# Patient Record
Sex: Male | Born: 1970 | Race: White | Hispanic: No | Marital: Married
Health system: Southern US, Community
[De-identification: ages and names within clinical notes are randomized; demographics above are authoritative.]

---

## 1997-07-12 ENCOUNTER — Emergency Department (HOSPITAL_COMMUNITY): Admission: EM | Admit: 1997-07-12 | Discharge: 1997-07-13 | Payer: Self-pay | Admitting: Emergency Medicine

## 1998-12-19 ENCOUNTER — Ambulatory Visit (HOSPITAL_BASED_OUTPATIENT_CLINIC_OR_DEPARTMENT_OTHER): Admission: RE | Admit: 1998-12-19 | Discharge: 1998-12-19 | Payer: Self-pay | Admitting: Plastic Surgery

## 2004-10-19 ENCOUNTER — Encounter: Admission: RE | Admit: 2004-10-19 | Discharge: 2004-10-19 | Payer: Self-pay | Admitting: Orthopedic Surgery

## 2021-02-18 ENCOUNTER — Other Ambulatory Visit: Payer: Self-pay

## 2021-02-18 ENCOUNTER — Emergency Department (HOSPITAL_COMMUNITY)
Admission: EM | Admit: 2021-02-18 | Discharge: 2021-02-19 | Disposition: A | Discharge status: Home / Self Care | Payer: BC Managed Care – PPO | Attending: Emergency Medicine | Admitting: Emergency Medicine

## 2021-02-18 ENCOUNTER — Emergency Department (HOSPITAL_COMMUNITY): Payer: BC Managed Care – PPO

## 2021-02-18 DIAGNOSIS — I160 Hypertensive urgency: Secondary | ICD-10-CM

## 2021-02-18 DIAGNOSIS — I10 Essential (primary) hypertension: Secondary | ICD-10-CM | POA: Insufficient documentation

## 2021-02-18 DIAGNOSIS — R079 Chest pain, unspecified: Secondary | ICD-10-CM | POA: Diagnosis present

## 2021-02-18 DIAGNOSIS — R002 Palpitations: Secondary | ICD-10-CM | POA: Insufficient documentation

## 2021-02-18 DIAGNOSIS — R42 Dizziness and giddiness: Secondary | ICD-10-CM | POA: Diagnosis not present

## 2021-02-18 DIAGNOSIS — Z79899 Other long term (current) drug therapy: Secondary | ICD-10-CM | POA: Insufficient documentation

## 2021-02-18 LAB — BASIC METABOLIC PANEL
Anion gap: 12 (ref 5–15)
BUN: 13 mg/dL (ref 6–20)
CO2: 27 mmol/L (ref 22–32)
Calcium: 9.7 mg/dL (ref 8.9–10.3)
Chloride: 95 mmol/L — ABNORMAL LOW (ref 98–111)
Creatinine, Ser: 1.08 mg/dL (ref 0.61–1.24)
Glucose, Bld: 108 mg/dL — ABNORMAL HIGH (ref 70–99)
Potassium: 4 mmol/L (ref 3.5–5.1)
Sodium: 134 mmol/L — ABNORMAL LOW (ref 135–145)

## 2021-02-18 LAB — CBC
HCT: 45.6 % (ref 39.0–52.0)
Hemoglobin: 15.5 g/dL (ref 13.0–17.0)
MCH: 32.6 pg (ref 26.0–34.0)
MCHC: 34 g/dL (ref 30.0–36.0)
MCV: 96 fL (ref 80.0–100.0)
Platelets: 182 10*3/uL (ref 150–400)
RBC: 4.75 MIL/uL (ref 4.22–5.81)
RDW: 12.4 % (ref 11.5–15.5)
WBC: 5.1 10*3/uL (ref 4.0–10.5)
nRBC: 0 % (ref 0.0–0.2)

## 2021-02-18 LAB — TROPONIN I (HIGH SENSITIVITY): Troponin I (High Sensitivity): 8 ng/L (ref <18)

## 2021-02-18 MED ORDER — ASPIRIN 81 MG PO CHEW: 324.0000 mg | CHEWABLE_TABLET | Freq: Once | ORAL | Status: DC

## 2021-02-18 NOTE — ED Provider Triage Note (Signed)
Emergency Medicine Provider Triage Evaluation Note  Dan Lam , a 51 y.o. male  was evaluated in triage.  Pt complains of chest tightness, diaphoresis, some SOB with exertion earlier today. Was doing chores, working out when he felt new chest tightness / pressure. No radiation to arms / neck. 1st family history of ACS.  No diabetes, hypertension, high cholesterol at baseline. Has not taken anything for pain. No cardiac hx.  Review of Systems  Positive: Chest tightness, SHOB Negative: NVD  Physical Exam  BP (!) 186/114 (BP Location: Right Arm)    Pulse (!) 110    Temp 98 F (36.7 C)    Resp 16    Ht 5\' 9"  (1.753 m)    Wt 82.6 kg    SpO2 100%    BMI 26.88 kg/m  Gen:   Awake, no distress   Resp:  Normal effort  MSK:   Moves extremities without difficulty  Other:  Somewhat tachycardic on my exam  Medical Decision Making  Medically screening exam initiated at 9:59 PM.  Appropriate orders placed.  Avie Echevaria was informed that the remainder of the evaluation will be completed by another provider, this initial triage assessment does not replace that evaluation, and the importance of remaining in the ED until their evaluation is complete.  Workup initiated   Anselmo Pickler, [place] 02/18/21 2201

## 2021-02-18 NOTE — ED Triage Notes (Signed)
Pt c/o chest tightness, hypertension and palpitations.

## 2021-02-19 LAB — TROPONIN I (HIGH SENSITIVITY): Troponin I (High Sensitivity): 10 ng/L (ref <18)

## 2021-02-19 MED ORDER — AMLODIPINE BESYLATE 5 MG PO TABS
10.0000 mg | ORAL_TABLET | Freq: Once | ORAL | Status: AC
  Administered 2021-02-19: 10 mg via ORAL
  Filled 2021-02-19: qty 2

## 2021-02-19 MED ORDER — AMLODIPINE BESYLATE 10 MG PO TABS: 10.0000 mg | ORAL_TABLET | Freq: Every day | ORAL | 11 refills | Status: DC

## 2021-02-19 NOTE — Discharge Instructions (Signed)
Please take amlodipine 10mg  once daily and be sure to follow up with your PCP.

## 2021-02-19 NOTE — ED Provider Notes (Addendum)
[facility] EMERGENCY DEPARTMENT Provider Note   CSN: [id number] Arrival date & time: 02/18/21  2012     History  Chief Complaint  Patient presents with   Chest Pain   Hypertension    Dan Lam is a 51 y.o. male PMHx of HTN not on any medication presented with elevated BP noted on BP home monitor yesterday. Pt endorse associated dizziness, palpitations and chest discomfort. While in the Ed waiting room overnight, pt chest discomfort and dizziness resolved however palpitations persisted. Pt denies SOB, fever, chills or syncope. Pt endorse similar syx many years ago in which he was placed on antihypertensives. Otherwise, pt states he feels much better. Denies sick contacts. Denies smoking or illicit drug use, does endorse occasional alcohol use. Pt endorse poor eating habits over the holidays and likely consumed lots of "salty" foods that may have contributed to his elevated pressures.   Chest Pain Associated symptoms: palpitations   Associated symptoms: no dizziness, no fever, no headache, no nausea, no numbness, no shortness of breath and no vomiting   Hypertension Pertinent negatives include no chest pain, no headaches and no shortness of breath.      Home Medications Prior to Admission medications   Medication Sig Start Date End Date Taking? Authorizing Provider  amLODipine (NORVASC) 10 MG tablet Take 1 tablet (10 mg total) by mouth daily. 02/19/21 02/19/22 Yes Timothy Lasso, MD  dorzolamide-timolol (COSOPT) 22.3-6.8 MG/ML ophthalmic solution Place 1 drop into the left eye 2 (two) times daily.   Yes [provider]  ibuprofen (ADVIL) 200 MG tablet Take 400 mg by mouth every 6 (six) hours as needed for moderate pain or headache.   Yes [provider]      Allergies    Patient has no known allergies.    Review of Systems   Review of Systems  Constitutional:  Negative for chills and fever.  Eyes:  Negative for visual disturbance.   Respiratory:  Negative for shortness of breath.   Cardiovascular:  Positive for palpitations. Negative for chest pain.  Gastrointestinal:  Negative for constipation, diarrhea, nausea and vomiting.  Neurological:  Negative for dizziness, numbness and headaches.   Physical Exam Updated Vital Signs BP (!) 170/107    Pulse 88    Temp 98.2 F (36.8 C)    Resp 18    Ht 5\' 9"  (1.753 m)    Wt 82.6 kg    SpO2 100%    BMI 26.88 kg/m  Physical Exam Constitutional:      General: He is not in acute distress. HENT:     Head: Normocephalic and atraumatic.  Cardiovascular:     Rate and Rhythm: Normal rate and regular rhythm.     Heart sounds: Normal heart sounds.  Pulmonary:     Effort: Pulmonary effort is normal.     Breath sounds: Normal breath sounds and air entry.  Abdominal:     General: Abdomen is flat.     Palpations: Abdomen is soft.  Musculoskeletal:     Right lower leg: No edema.     Left lower leg: No edema.  Skin:    General: Skin is warm and dry.  Neurological:     General: No focal deficit present.     Mental Status: He is alert and oriented to person, place, and time. Mental status is at baseline.  Psychiatric:        Behavior: Behavior normal. Behavior is cooperative.    ED Results / Procedures /  Treatments   Labs (all labs ordered are listed, but only abnormal results are displayed) Labs Reviewed  BASIC METABOLIC PANEL - Abnormal; Notable for the following components:      Result Value   Sodium 134 (*)    Chloride 95 (*)    Glucose, Bld 108 (*)    All other components within normal limits  CBC  TROPONIN I (HIGH SENSITIVITY)  TROPONIN I (HIGH SENSITIVITY)    EKG None  Radiology DG Chest 2 View  Result Date: 02/18/2021 CLINICAL DATA:  Chest pain EXAM: CHEST - 2 VIEW COMPARISON:  None. FINDINGS: The heart size and mediastinal contours are within normal limits. Both lungs are clear. Old fifth rib fracture. IMPRESSION: No active cardiopulmonary disease.  Electronically Signed   By: Donavan Foil M.D.   On: 02/18/2021 22:24    Procedures Procedures    Medications Ordered in ED Medications  aspirin chewable tablet 324 mg (has no administration in time range)  amLODipine (NORVASC) tablet 10 mg (10 mg Oral Given 02/19/21 1217)    ED Course/ Medical Decision Making/ A&P                           Medical Decision Making  Severe asymptomatic hypertension Pt presents with elevated SBP >180. On exam, pt is asymptomatic; denies CP, SOB, headache, nausea or vomiting. Pt was initially tachycardic in triage which since resolved in the ED. EKG sinus tachycardia, non concerning for ischemia. CXR negative for any active cardiopulmonary disease. Initial labs were reassuring. No end organ damage noted. Cr levels were within normal limits. Trops were flat. Pt given a dose of amlodipine. Otherwise, pt clinically stable and has an upcoming PCP appt this week. Pt is stable for discharge and will be discharged with amlodipine 10mg  daily.        Final Clinical Impression(s) / ED Diagnoses Final diagnoses:  Hypertensive urgency    Rx / DC Orders ED Discharge Orders          Ordered    amLODipine (NORVASC) 10 MG tablet  Daily        02/19/21 1215              Timothy Lasso, MD 02/19/21 1223    Timothy Lasso, MD 02/19/21 1227    Isla Pence, MD 02/19/21 1320

## 2021-02-19 NOTE — ED Notes (Signed)
Pt verbalized understanding of d/c instructions, meds and followup care. Denies questions. VSS, no distress noted. Steady gait to exit with all belongings.  ?

## 2021-03-30 ENCOUNTER — Institutional Professional Consult (permissible substitution): Payer: BC Managed Care – PPO | Admitting: Plastic Surgery

## 2021-04-20 ENCOUNTER — Other Ambulatory Visit: Payer: Self-pay

## 2021-04-20 ENCOUNTER — Ambulatory Visit: Payer: BC Managed Care – PPO | Admitting: Plastic Surgery

## 2021-04-20 VITALS — BP 110/74 | HR 67 | Ht 69.0 in | Wt 194.6 lb

## 2021-04-20 DIAGNOSIS — D489 Neoplasm of uncertain behavior, unspecified: Secondary | ICD-10-CM | POA: Diagnosis not present

## 2021-04-20 NOTE — Progress Notes (Signed)
? ?  Referring Provider ?Chesley Noon, MD ?[place],  [place] [address]  ? ?CC:  ?Left temple nevus, left scalp lesion ? ? ?Dan Lam is an 51 y.o. male.  ?HPI: Patient is a 51 year old with 2 lesions he is concerned about.  He has a left temple lesion which was shaved many years ago.  He does not have the path report available but he would like this removed completely.  He also has a lesion on his left scalp vertex that he would like removed.  He has been told that this may be a seborrheic keratosis.  Lesions have gotten slightly larger in size. ? ?No Known Allergies ? ?Outpatient Encounter Medications as of 04/20/2021  ?Medication Sig  ? dorzolamide-timolol (COSOPT) 22.3-6.8 MG/ML ophthalmic solution Place 1 drop into the left eye 2 (two) times daily.  ? ibuprofen (ADVIL) 200 MG tablet Take 400 mg by mouth every 6 (six) hours as needed for moderate pain or headache.  ? olmesartan (BENICAR) 40 MG tablet Take 40 mg by mouth daily.  ? [DISCONTINUED] amLODipine (NORVASC) 10 MG tablet Take 1 tablet (10 mg total) by mouth daily.  ? ?No facility-administered encounter medications on file as of 04/20/2021.  ?  ? ?Past medical history: Hypertension ? ?No family history on file. ? ?Social History  ? ?Social History Narrative  ? Not on file  ?  ? ?Review of Systems ?General: Denies fevers, chills, weight loss ?CV: Denies chest pain, shortness of breath, palpitations ? ? ?Physical Exam ?Vitals with BMI 04/20/2021 02/19/2021 02/19/2021  ?Height '5\' 9"'$  - -  ?Weight 194 lbs 10 oz - -  ?BMI 28.72 - -  ?Systolic 353 299 242  ?Diastolic 74 683 419  ?Pulse 67 88 91  ?  ?General:  No acute distress,  Alert and oriented, Non-Toxic, Normal speech and affect ?HEENT: Left temple 8 mm tan colored lesion.  Left scalp vertex 9 mm waxy lesion ? ?Assessment/Plan ?Left temple nevus and left scalp vertex likely seborrheic keratosis.  We will plan for excision under local in the office. ? ?Lennice Sites ?04/20/2021, 9:14 AM   ? ? ?  ?

## 2021-05-25 ENCOUNTER — Ambulatory Visit: Payer: BC Managed Care – PPO | Admitting: Plastic Surgery

## 2021-06-01 ENCOUNTER — Other Ambulatory Visit (HOSPITAL_COMMUNITY)
Admission: RE | Admit: 2021-06-01 | Discharge: 2021-06-01 | Disposition: A | Discharge status: Home / Self Care | Payer: BC Managed Care – PPO | Source: Ambulatory Visit | Attending: Plastic Surgery | Admitting: Plastic Surgery

## 2021-06-01 ENCOUNTER — Ambulatory Visit: Payer: BC Managed Care – PPO | Admitting: Plastic Surgery

## 2021-06-01 VITALS — BP 134/91 | HR 63 | Ht 69.0 in | Wt 190.6 lb

## 2021-06-01 DIAGNOSIS — D489 Neoplasm of uncertain behavior, unspecified: Secondary | ICD-10-CM | POA: Diagnosis present

## 2021-06-01 DIAGNOSIS — D2239 Melanocytic nevi of other parts of face: Secondary | ICD-10-CM | POA: Diagnosis not present

## 2021-06-01 NOTE — Progress Notes (Signed)
Operative Note  ? ?DATE OF OPERATION: 06/01/2021 ? ?LOCATION:   ? ?SURGICAL DEPARTMENT: Plastic Surgery ? ?PREOPERATIVE DIAGNOSES:   ?Left scalp lesion ?Left temple lesion ? ?POSTOPERATIVE DIAGNOSES:  same ? ?PROCEDURE:  ?Excision of left temple measuring 1.5 cm ?Left temple intermediate closure measuring 1.5 cm ?3.  Excision left scalp vertex 1.7 cm ?4.  Left scalp 1.7 cm intermediate closure. ? ?SURGEON: Melene Plan. Saiquan Hands, MD ? ?ANESTHESIA:  Local ? ?COMPLICATIONS: None.  ? ?INDICATIONS FOR PROCEDURE:  ?The patient, Dan Lam is a 51 y.o. male born on 1970/11/29, is here for treatment of left temple and left scalp neoplasms.  Patient had concerns about these being malignant and wanted some removed.  The lesion in his scalp bothers him when he combs his hair. ? ?MRN: [id number] ? ?CONSENT:  ?Informed consent was obtained directly from the patient. Risks, benefits and alternatives were fully discussed. Specific risks including but not limited to bleeding, infection, hematoma, seroma, scarring, pain, infection, wound healing problems, and need for further surgery were all discussed. The patient did have an ample opportunity to have questions answered to satisfaction.  ? ?DESCRIPTION OF PROCEDURE:  ?Local anesthesia was administered. The patient's operative site was prepped and draped in a sterile fashion. A time out was performed and all information was confirmed to be correct.  The left temple lesion was excised with a 15 blade.  Care was taken to stay in a subcutaneous plane.  Hemostasis was obtained.  Circumferential undermining was performed and the skin was advanced and closed in layers with interrupted buried Monocryl sutures and Prolene for the skin.  The lesion excised measured 1.5 cm, and the total length of closure measured 1.5 cm.   ? ?Attention was then turned toward the left scalp lesion.  A 1.7 cm ellipse was made with a 15 blade.  The lesion was excised in a subcutaneous plane.  Bovie was used for  hemostasis.  Undermining was achieved with scissor dissection.  Following this interrupted 3-0 Vicryl followed by three 3-0 Prolene was used to close the skin. ? ?The patient tolerated the procedure well.  There were no complications. ?  ?

## 2021-06-05 LAB — SURGICAL PATHOLOGY

## 2021-06-08 ENCOUNTER — Ambulatory Visit (INDEPENDENT_AMBULATORY_CARE_PROVIDER_SITE_OTHER): Payer: BC Managed Care – PPO | Admitting: Plastic Surgery

## 2021-06-08 ENCOUNTER — Ambulatory Visit: Payer: BC Managed Care – PPO | Admitting: Plastic Surgery

## 2021-06-08 DIAGNOSIS — L821 Other seborrheic keratosis: Secondary | ICD-10-CM

## 2021-06-08 NOTE — Progress Notes (Signed)
Status post excision left temple and scalp vertex.  Patient is doing well. ? ?Physical exam ?Incision clean dry intact ? ?Pathology: ?FINAL MICROSCOPIC DIAGNOSIS:  ? ?A. SKIN, LEFT TEMPLE, EXCISION:  ?-  Melanocytic nevus, intradermal  ?-  See comment  ? ?B. SKIN, LEFT SCALP, EXCISION:  ?-  Lichenoid seborrheic keratosis  ?-  See comment  ? ?Assessment and plan ?Incisions healing well, pathology is benign.  We will see the patient back next week to take sutures out to the scalp. ?

## 2021-06-15 ENCOUNTER — Ambulatory Visit (INDEPENDENT_AMBULATORY_CARE_PROVIDER_SITE_OTHER): Payer: BC Managed Care – PPO | Admitting: Plastic Surgery

## 2021-06-15 DIAGNOSIS — D489 Neoplasm of uncertain behavior, unspecified: Secondary | ICD-10-CM

## 2021-06-15 NOTE — Progress Notes (Signed)
Status post excision of nevus on the top of the head as well as the left temple.  Doing well without complaints.  Presents for suture removal scalp. ? ?Physical exam ?Incision clean dry and intact, no erythema ? ?Assessment and plan ?Patient is doing well after excision of lesions on the left temple and left scalp.  Pathology is benign.  We will see him back as needed. ?

## 2022-05-25 IMAGING — CR DG CHEST 2V
2 series · 2 of 2 positions shown · non-contrast
Comparison: None.

CLINICAL DATA: Chest pain

EXAM:
CHEST - 2 VIEW

[chest pa]
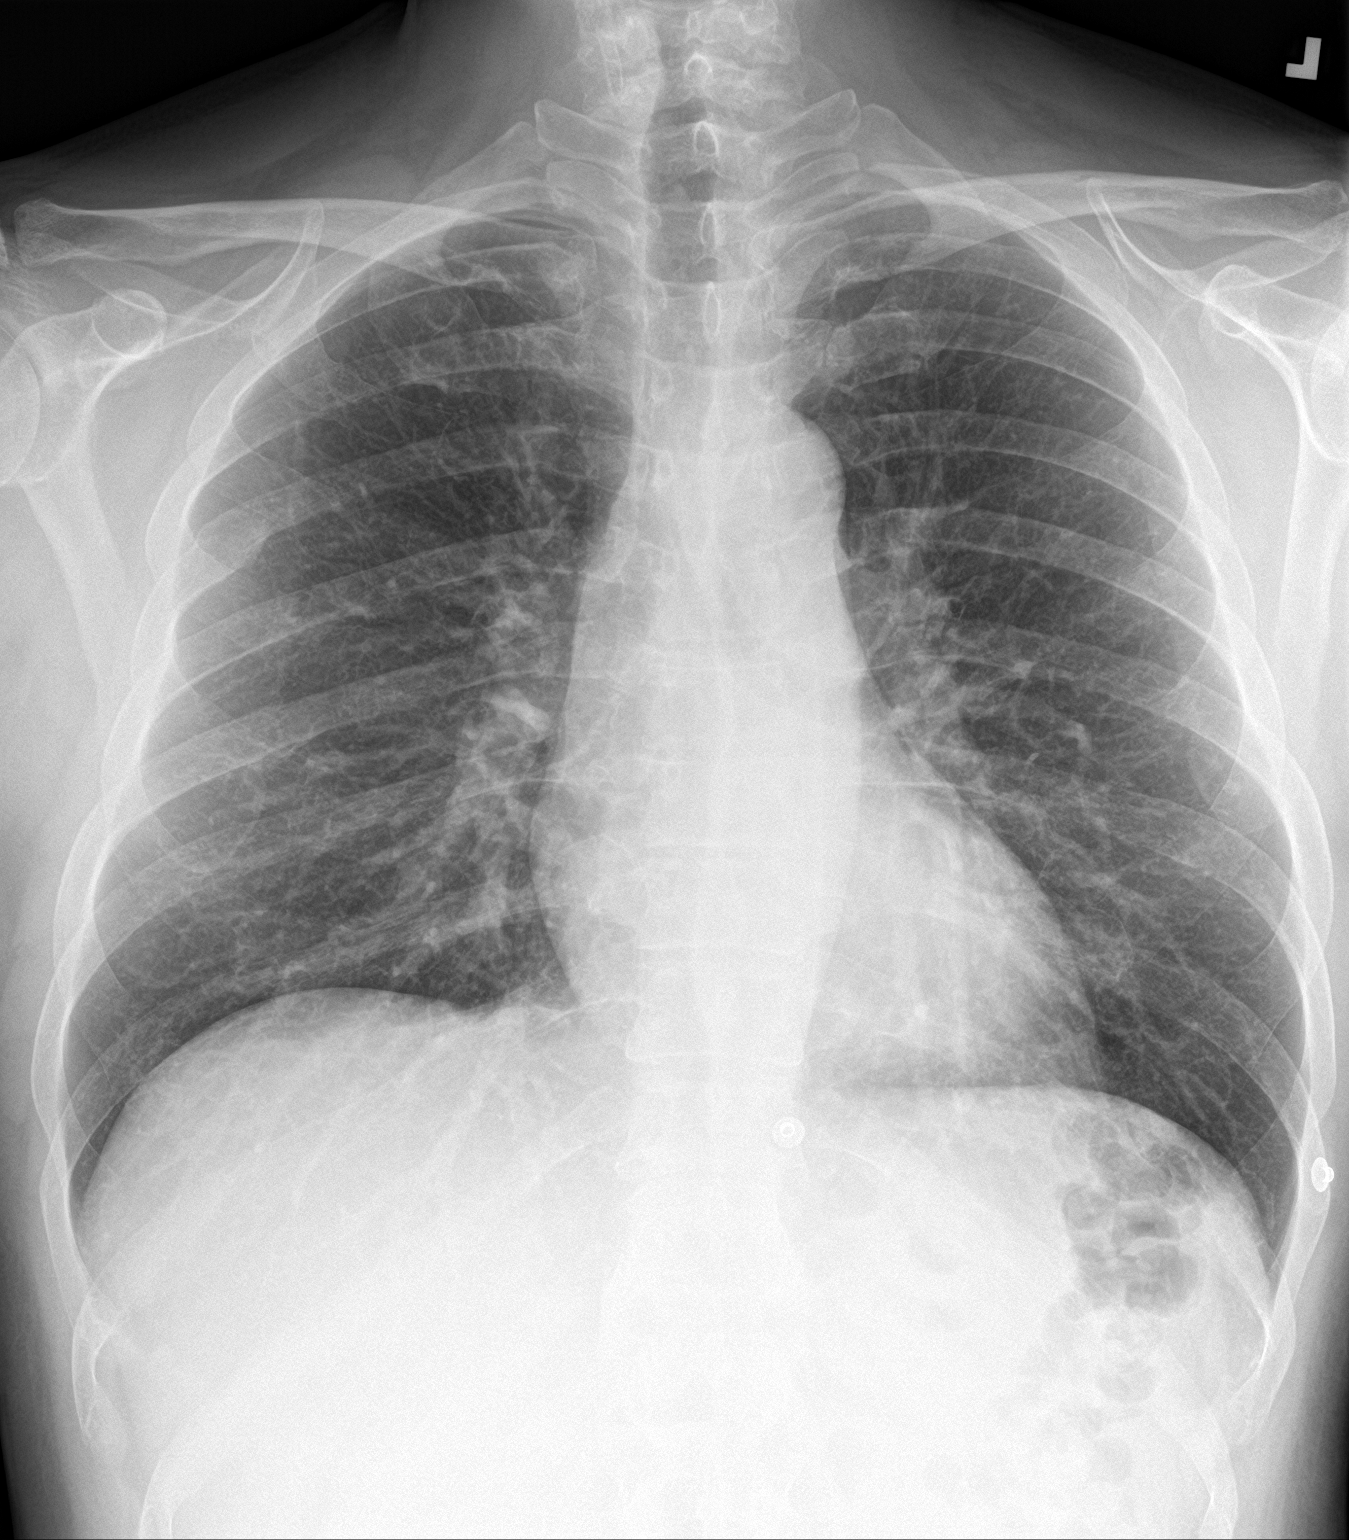

[chest lat]
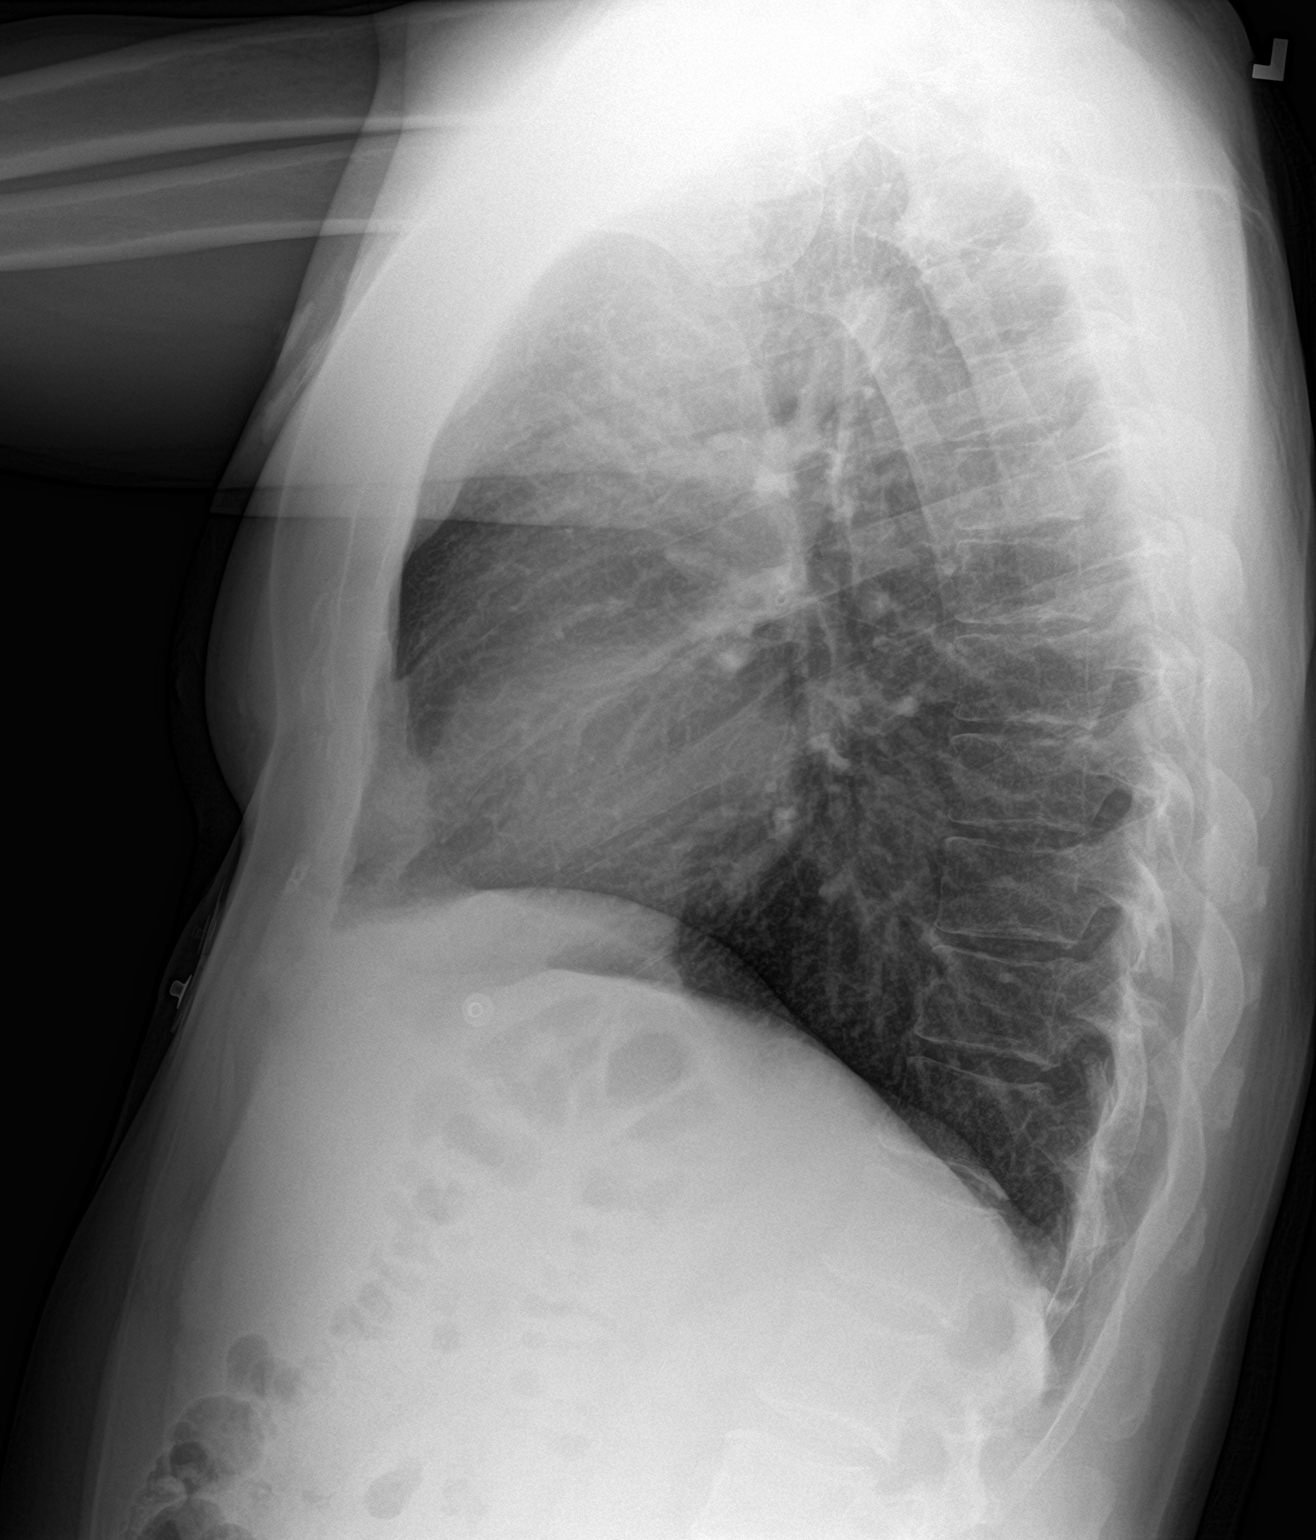

[2 of 2 positions shown; findings below may reference images not displayed]

FINDINGS: The heart size and mediastinal contours are within normal limits.
Both lungs are clear. Old fifth rib fracture.
IMPRESSION: No active cardiopulmonary disease.
